# Patient Record
Sex: Male | Born: 1960 | Race: White | Hispanic: No | Marital: Married | Smoking: Never smoker
Health system: Southern US, Community
[De-identification: ages and names within clinical notes are randomized; demographics above are authoritative.]

## PROBLEM LIST (undated history)

## (undated) DIAGNOSIS — E7800 Pure hypercholesterolemia, unspecified: Secondary | ICD-10-CM

## (undated) DIAGNOSIS — J302 Other seasonal allergic rhinitis: Secondary | ICD-10-CM

## (undated) DIAGNOSIS — R7989 Other specified abnormal findings of blood chemistry: Secondary | ICD-10-CM

---

## 2011-10-05 ENCOUNTER — Encounter (HOSPITAL_BASED_OUTPATIENT_CLINIC_OR_DEPARTMENT_OTHER): Payer: Self-pay | Admitting: *Deleted

## 2011-10-05 ENCOUNTER — Emergency Department (HOSPITAL_BASED_OUTPATIENT_CLINIC_OR_DEPARTMENT_OTHER)
Admission: EM | Admit: 2011-10-05 | Discharge: 2011-10-06 | Disposition: A | Discharge status: Home / Self Care | Payer: 59 | Attending: Emergency Medicine | Admitting: Emergency Medicine

## 2011-10-05 DIAGNOSIS — E7800 Pure hypercholesterolemia, unspecified: Secondary | ICD-10-CM | POA: Insufficient documentation

## 2011-10-05 DIAGNOSIS — K3189 Other diseases of stomach and duodenum: Secondary | ICD-10-CM | POA: Insufficient documentation

## 2011-10-05 DIAGNOSIS — R002 Palpitations: Secondary | ICD-10-CM | POA: Insufficient documentation

## 2011-10-05 DIAGNOSIS — R0789 Other chest pain: Secondary | ICD-10-CM | POA: Insufficient documentation

## 2011-10-05 HISTORY — DX: Other seasonal allergic rhinitis: J30.2

## 2011-10-05 HISTORY — DX: Pure hypercholesterolemia, unspecified: E78.00

## 2011-10-05 HISTORY — DX: Other specified abnormal findings of blood chemistry: R79.89

## 2011-10-05 NOTE — ED Notes (Signed)
Pt states he was awakened this p.m. with chest tightness and heart racing. "Indigestion" Felt "gassy". Taken to ED 8. EKG obtained. Placed on monitor showing NSR. Dr. Anitra Lauth in to evaluate.

## 2011-10-05 NOTE — ED Provider Notes (Addendum)
History  This chart was scribed for Glenn Sprout, MD by Shari Heritage. The patient was seen in room MH08/MH08. Patient's care was started at 2350.     CSN: [id number]  Arrival date & time 10/05/11  2345   First MD Initiated Contact with Patient 10/05/11 2350      Chief Complaint  Patient presents with  . Chest Pain    The history is provided by the patient. No language interpreter was used.    Glenn Wallace is a 51 y.o. male who presents to the Emergency Department complaining of an episode of pressure like, moderate, non-radiating chest pain with associated heart palpitations and indigestion-like discomfort onset 1 hour ago. Patient denies SOB, nausea or vomiting. Patient says that palpitations and chest pain woke him up at about 11:00pm. Patient states that chest pressure is resolved, but he still has some indigestion. He states that he experiences some indigestion every few months, but it is not recurrent or chronic. Patient states that he had a brownie and a glass of milk before bed and attended a cook out earlier in the day where he had 1 beer. Patient has a history of hypercholesteremia and is taking Lipitor daily. He also has a history of seasonal allergies and low testosterone. Patient has a family history of HTN (mother) and heart attack (maternal grandfather at 53). Patient had an EKG done several years ago. Patient has never smoked.  Past Medical History  Diagnosis Date  . Hypercholesteremia   . Seasonal allergies   . Low testosterone     History reviewed. No pertinent past surgical history.  History reviewed. No pertinent family history.  History  Substance Use Topics  . Smoking status: Not on file  . Smokeless tobacco: Not on file  . Alcohol Use: Not on file      Review of Systems A complete 10 system review of systems was obtained and all systems are negative except as noted in the HPI and PMH.   Allergies  Review of patient's allergies indicates not on  file.  Home Medications  No current outpatient prescriptions on file.  BP 137/98  Pulse 101  Temp 97.9 F (36.6 C) (Oral)  Resp 20  Ht 5\' 9"  (1.753 m)  Wt 235 lb (106.595 kg)  BMI 34.70 kg/m2  SpO2 98%  Physical Exam  Nursing note and vitals reviewed. Constitutional: He is oriented to person, place, and time. He appears well-developed and well-nourished.  HENT:  Head: Normocephalic and atraumatic.  Eyes: Conjunctivae normal and EOM are normal. Pupils are equal, round, and reactive to light.  Neck: Normal range of motion. Neck supple.  Cardiovascular: Normal rate, regular rhythm and normal heart sounds.   Pulmonary/Chest: Effort normal and breath sounds normal.  Abdominal: Soft. Bowel sounds are normal.  Musculoskeletal: Normal range of motion.  Neurological: He is alert and oriented to person, place, and time.  Skin: Skin is warm and dry.  Psychiatric: He has a normal mood and affect.    ED Course  Procedures (including critical care time) DIAGNOSTIC STUDIES: Oxygen Saturation is 98% on room air, normal by my interpretation.    COORDINATION OF CARE: 11:57pm- Patient informed of current plan for treatment and evaluation and agrees with plan at this time.    Labs Reviewed  BASIC METABOLIC PANEL - Abnormal; Notable for the following:    Glucose, Bld 128 (*)     BUN 24 (*)     GFR calc non Af Amer 86 (*)  All other components within normal limits  CBC WITH DIFFERENTIAL  TROPONIN I  TROPONIN I  TROPONIN I    Dg Chest 2 View  10/06/2011  *RADIOLOGY REPORT*  Clinical Data: Chest pain, palpitations.  CHEST - 2 VIEW  Comparison: None.  Findings: Heart size within normal limits.  Mild left lung base opacity.  Right hemidiaphragm eventration.  Hypoaeration with interstitial and vascular crowding.  No focal consolidation otherwise.  No pleural effusion or pneumothorax.  No acute osseous finding.  IMPRESSION: Mild left lung base atelectasis or scarring.  Interstitial and  vascular crowding without focal consolidation.   Original Report Authenticated By: Waneta Martins, M.D.      Date: 10/06/2011  Rate: 92  Rhythm: normal sinus rhythm  QRS Axis: normal  Intervals: normal  ST/T Wave abnormalities: normal  Conduction Disutrbances: none  Narrative Interpretation: unremarkable     1. Atypical chest pain   2. Palpitations       MDM   Patient was awoke tonight after falling asleep with chest tightness and heart racing. He states this has been ongoing for the last one hour. At home he was checking his pulse and it was between 120 and 150. He was unclear if it was irregular. Since he's been here his pulse is been less than 100 and regular. EKG is within normal limits. Patient's only risk factor for cardiac disease is hypercholesterolemia. He's never had chest pain before. He states in the past he had an EKG in his PCP office which was normal but he has had no other cardiac workup. Patient does have a concerning story today it is unclear whether the rapid heart rate was causing his chest tightness versus chest tightness causing a rapid heart rate. Currently he is just complaining of some mild feelings of indigestion but no further chest tightness which was on both sides wrapping around his chest. Feel he would be a good candidate for the low risk chest pain protocol and CT. CBC, BMP, cardiac enzymes, chest x-ray pending. Patient given aspirin and nitroglycerin.  1:51 AM Labs and plain film wnl.  Pt's pain had resolved and did not require NTG.  He took 325 of aspirin just PTA.  Discussed with pt low risk chest pain protocol and he did not want to stay due to children at home and his wife out of town.  He did agree to stay for 3 hour markers which would place him 4 hours out from when pain started.  Pt understands risk and wants to f/u with PCP on Monday.  Feel this is reasonable as pt is low risk and TIMI 0.  3:46 AM Second troponin is neg.  Pt continues to be  pain and palpitation free.  D/ced home.   I personally performed the services described in this documentation, which was scribed in my presence.  The recorded information has been reviewed and considered.    Glenn Sprout, MD 10/06/11 1610  Glenn Sprout, MD 10/06/11 9604  Glenn Sprout, MD 10/06/11 [id number]

## 2011-10-06 ENCOUNTER — Encounter (HOSPITAL_BASED_OUTPATIENT_CLINIC_OR_DEPARTMENT_OTHER): Payer: Self-pay

## 2011-10-06 ENCOUNTER — Emergency Department (HOSPITAL_BASED_OUTPATIENT_CLINIC_OR_DEPARTMENT_OTHER): Payer: 59

## 2011-10-06 LAB — CBC WITH DIFFERENTIAL/PLATELET
Basophils Absolute: 0.1 10*3/uL (ref 0.0–0.1)
Basophils Relative: 1 % (ref 0–1)
MCHC: 35.4 g/dL (ref 30.0–36.0)
Neutro Abs: 4 10*3/uL (ref 1.7–7.7)
Neutrophils Relative %: 49 % (ref 43–77)
Platelets: 280 10*3/uL (ref 150–400)
RDW: 13.2 % (ref 11.5–15.5)

## 2011-10-06 LAB — TROPONIN I: Troponin I: <0.3 ng/mL (ref <0.30)

## 2011-10-06 LAB — BASIC METABOLIC PANEL
Chloride: 105 mEq/L (ref 96–112)
GFR calc Af Amer: >90 mL/min (ref >90)
GFR calc non Af Amer: 86 mL/min — ABNORMAL LOW (ref >90)
Potassium: 3.9 mEq/L (ref 3.5–5.1)

## 2011-10-06 MED ORDER — ASPIRIN 81 MG PO CHEW
324.0000 mg | CHEWABLE_TABLET | Freq: Once | ORAL | Status: DC
  Filled 2011-10-06: qty 4

## 2011-10-06 MED ORDER — NITROGLYCERIN 2 % TD OINT
TOPICAL_OINTMENT | TRANSDERMAL | Status: AC
  Filled 2011-10-06: qty 1

## 2011-10-06 MED ORDER — NITROGLYCERIN 0.4 MG SL SUBL
0.4000 mg | SUBLINGUAL_TABLET | SUBLINGUAL | Status: DC | PRN
  Administered 2011-10-06: 0.4 mg via SUBLINGUAL
  Filled 2011-10-06: qty 25

## 2011-10-06 NOTE — ED Notes (Signed)
Pt presents to ED today with centralized chest pain that started while pt was sleeping.  Pt rates 5/10 on scale now but states was 10/10 at onset.  Pt reports non-radiating.

## 2011-10-06 NOTE — ED Notes (Signed)
Pt. Denies any c/p sob. C/o Burning to upper abd area. Nitro given SL per order will monitor.

## 2011-10-06 NOTE — ED Notes (Signed)
Denies any change in "burning" to abdomen area after one nitro.

## 2011-10-06 NOTE — ED Notes (Signed)
Returned from  X-ray

## 2011-10-06 NOTE — ED Notes (Signed)
Pt. States he took 2 adult ASA at home around 2330. MD aware and will hold ASA order at this time.

## 2015-05-28 ENCOUNTER — Emergency Department (HOSPITAL_BASED_OUTPATIENT_CLINIC_OR_DEPARTMENT_OTHER): Payer: BLUE CROSS/BLUE SHIELD

## 2015-05-28 ENCOUNTER — Encounter (HOSPITAL_BASED_OUTPATIENT_CLINIC_OR_DEPARTMENT_OTHER): Payer: Self-pay

## 2015-05-28 ENCOUNTER — Observation Stay (HOSPITAL_BASED_OUTPATIENT_CLINIC_OR_DEPARTMENT_OTHER)
Admission: EM | Admit: 2015-05-28 | Discharge: 2015-05-29 | Disposition: A | Discharge status: Home / Self Care | Payer: BLUE CROSS/BLUE SHIELD | Attending: General Surgery | Admitting: General Surgery

## 2015-05-28 DIAGNOSIS — S2232XA Fracture of one rib, left side, initial encounter for closed fracture: Secondary | ICD-10-CM

## 2015-05-28 DIAGNOSIS — E7800 Pure hypercholesterolemia, unspecified: Secondary | ICD-10-CM | POA: Diagnosis not present

## 2015-05-28 DIAGNOSIS — Y93E9 Activity, other interior property and clothing maintenance: Secondary | ICD-10-CM | POA: Diagnosis not present

## 2015-05-28 DIAGNOSIS — S2242XA Multiple fractures of ribs, left side, initial encounter for closed fracture: Secondary | ICD-10-CM | POA: Diagnosis not present

## 2015-05-28 DIAGNOSIS — Y998 Other external cause status: Secondary | ICD-10-CM | POA: Diagnosis not present

## 2015-05-28 DIAGNOSIS — W133XXA Fall through floor, initial encounter: Secondary | ICD-10-CM | POA: Diagnosis not present

## 2015-05-28 DIAGNOSIS — Z7982 Long term (current) use of aspirin: Secondary | ICD-10-CM | POA: Diagnosis not present

## 2015-05-28 DIAGNOSIS — I471 Supraventricular tachycardia: Secondary | ICD-10-CM | POA: Diagnosis not present

## 2015-05-28 DIAGNOSIS — Y92015 Private garage of single-family (private) house as the place of occurrence of the external cause: Secondary | ICD-10-CM | POA: Insufficient documentation

## 2015-05-28 DIAGNOSIS — S270XXA Traumatic pneumothorax, initial encounter: Secondary | ICD-10-CM | POA: Diagnosis present

## 2015-05-28 DIAGNOSIS — J939 Pneumothorax, unspecified: Secondary | ICD-10-CM

## 2015-05-28 DIAGNOSIS — Z09 Encounter for follow-up examination after completed treatment for conditions other than malignant neoplasm: Secondary | ICD-10-CM

## 2015-05-28 DIAGNOSIS — W19XXXA Unspecified fall, initial encounter: Secondary | ICD-10-CM

## 2015-05-28 LAB — BASIC METABOLIC PANEL
Anion gap: 5 (ref 5–15)
BUN: 23 mg/dL — ABNORMAL HIGH (ref 6–20)
CHLORIDE: 107 mmol/L (ref 101–111)
CO2: 25 mmol/L (ref 22–32)
CREATININE: 1.5 mg/dL — AB (ref 0.61–1.24)
Calcium: 8.7 mg/dL — ABNORMAL LOW (ref 8.9–10.3)
GFR calc non Af Amer: 51 mL/min — ABNORMAL LOW (ref >60)
GFR, EST AFRICAN AMERICAN: 59 mL/min — AB (ref >60)
Glucose, Bld: 138 mg/dL — ABNORMAL HIGH (ref 65–99)
POTASSIUM: 3.8 mmol/L (ref 3.5–5.1)
SODIUM: 137 mmol/L (ref 135–145)

## 2015-05-28 LAB — CBC WITH DIFFERENTIAL/PLATELET
BASOS PCT: 0 %
Basophils Absolute: 0 10*3/uL (ref 0.0–0.1)
EOS ABS: 0.2 10*3/uL (ref 0.0–0.7)
Eosinophils Relative: 2 %
HEMATOCRIT: 41.1 % (ref 39.0–52.0)
HEMOGLOBIN: 14.3 g/dL (ref 13.0–17.0)
LYMPHS ABS: 4.8 10*3/uL — AB (ref 0.7–4.0)
Lymphocytes Relative: 49 %
MCH: 30.6 pg (ref 26.0–34.0)
MCHC: 34.8 g/dL (ref 30.0–36.0)
MCV: 88 fL (ref 78.0–100.0)
MONOS PCT: 10 %
Monocytes Absolute: 1 10*3/uL (ref 0.1–1.0)
NEUTROS ABS: 3.9 10*3/uL (ref 1.7–7.7)
NEUTROS PCT: 39 %
Platelets: 320 10*3/uL (ref 150–400)
RBC: 4.67 MIL/uL (ref 4.22–5.81)
RDW: 13.2 % (ref 11.5–15.5)
WBC: 9.9 10*3/uL (ref 4.0–10.5)

## 2015-05-28 LAB — TROPONIN I

## 2015-05-28 MED ORDER — IOPAMIDOL (ISOVUE-300) INJECTION 61%
100.0000 mL | Freq: Once | INTRAVENOUS | Status: AC | PRN
Start: 2015-05-28 — End: 2015-05-28
  Administered 2015-05-28: 100 mL via INTRAVENOUS

## 2015-05-28 MED ORDER — SODIUM CHLORIDE 0.9 % IV BOLUS (SEPSIS)
500.0000 mL | Freq: Once | INTRAVENOUS | Status: AC
  Administered 2015-05-28: 500 mL via INTRAVENOUS

## 2015-05-28 MED ORDER — HYDROMORPHONE HCL 1 MG/ML IJ SOLN
0.5000 mg | Freq: Once | INTRAMUSCULAR | Status: AC
  Administered 2015-05-28: 0.5 mg via INTRAVENOUS
  Filled 2015-05-28: qty 1

## 2015-05-28 MED ORDER — FENTANYL CITRATE (PF) 100 MCG/2ML IJ SOLN
100.0000 ug | Freq: Once | INTRAMUSCULAR | Status: AC
  Administered 2015-05-28: 100 ug via INTRAVENOUS
  Filled 2015-05-28: qty 2

## 2015-05-28 NOTE — ED Provider Notes (Signed)
CSN: [id number]     Arrival date & time 05/28/15  2036 History  By signing my name below, I, Phillis Haggis, attest that this documentation has been prepared under the direction and in the presence of Benjiman Core, MD. Electronically Signed: Phillis Haggis, ED Scribe. 05/28/2015. 8:57 PM.  Chief Complaint  Patient presents with  . Fall   The history is provided by the patient. No language interpreter was used.  HPI Comments: Glenn Wallace is a 55 y.o. male who presents to the Emergency Department complaining of a fall onset PTA. Pt fell through the drywall of his attic, about 12 feet above ground, and landed on cement stairs. He reports pain to his entire left side including the ribs, abdomen, upper back, leg and arm. Pt has associated abrasions to the right leg, upper back, right arm and left arm. He denies hitting head, LOC, neck pain, or headache. States he has pain with breathing now. Difficult finding a comfortable position. He is not on anticoagulation.  Past Medical History  Diagnosis Date  . Hypercholesteremia   . Seasonal allergies   . Low testosterone    History reviewed. No pertinent past surgical history. Family History  Problem Relation Age of Onset  . Hypertension Mother   . Heart attack Other    Social History  Substance Use Topics  . Smoking status: Never Smoker   . Smokeless tobacco: None  . Alcohol Use: Yes    Review of Systems  Constitutional: Negative for fever.  Eyes: Negative for pain.  Respiratory: Positive for shortness of breath.   Cardiovascular: Positive for chest pain (left ribs).  Gastrointestinal: Positive for abdominal pain.  Genitourinary: Positive for flank pain.  Musculoskeletal: Positive for back pain and arthralgias. Negative for neck pain.  Skin: Positive for wound.  Neurological: Negative for syncope and headaches.  Hematological: Does not bruise/bleed easily.  All other systems reviewed and are negative.   Allergies  Review of  patient's allergies indicates no known allergies.  Home Medications   Prior to Admission medications   Medication Sig Start Date End Date Taking? Authorizing Provider  atorvastatin (LIPITOR) 20 MG tablet Take 20 mg by mouth daily.    Historical Provider, MD  cetirizine (ZYRTEC) 10 MG tablet Take 10 mg by mouth daily.    Historical Provider, MD  Testosterone (TESTIM TD) Place 0.1 mg onto the skin daily.    Historical Provider, MD   BP 130/74 mmHg  Pulse 93  Temp(Src) 97.5 F (36.4 C) (Oral)  Resp 18  SpO2 95% Physical Exam  Constitutional: He is oriented to person, place, and time. He appears well-developed and well-nourished.  uncomfortable  HENT:  Head: Normocephalic and atraumatic.  Eyes: EOM are normal. Pupils are equal, round, and reactive to light.  Neck: Normal range of motion. Neck supple.  Cardiovascular: Regular rhythm and normal heart sounds.  Tachycardia present.  Exam reveals no gallop and no friction rub.   No murmur heard. Tachycardia  Pulmonary/Chest: Effort normal and breath sounds normal. Tachypnea noted. He has no wheezes.  Equal breath sounds bilaterally. Moderate tenderness to left lateral chest wall. No initial subcutaneous emphysema.  Abdominal: Soft. There is tenderness in the left upper quadrant.  LUQ and left subcostal tenderness  Musculoskeletal: Normal range of motion.  Neurological: He is alert and oriented to person, place, and time.  Skin: Skin is warm. He is diaphoretic.  Abrasions to left forearm and elbow with no underlying bony tenderness echymotic area to left lateral lower mid  ribs posteriorly.  2 smaller linear abrasions to left leg with no underlying bony tenderness 1 small abrasion to right arm proximal to the elbow with no underlying bony tenderness Superficial abrasion/laceration to right anterior lower leg  Psychiatric: He has a normal mood and affect. His behavior is normal.  Nursing note and vitals reviewed.   ED Course   Procedures (including critical care time) DIAGNOSTIC STUDIES: Oxygen Saturation is 94% on RA, normal by my interpretation.    COORDINATION OF CARE: 8:55 PM-Discussed treatment plan which includes IV and x-ray with pt at bedside and pt agreed to plan.    Labs Review Labs Reviewed  CBC WITH DIFFERENTIAL/PLATELET - Abnormal; Notable for the following:    Lymphs Abs 4.8 (*)    All other components within normal limits  BASIC METABOLIC PANEL - Abnormal; Notable for the following:    Glucose, Bld 138 (*)    BUN 23 (*)    Creatinine, Ser 1.50 (*)    Calcium 8.7 (*)    GFR calc non Af Amer 51 (*)    GFR calc Af Amer 59 (*)    All other components within normal limits  TROPONIN I    Imaging Review Ct Chest W Contrast  05/28/2015  CLINICAL DATA:  Larey SeatFell through a ceiling.  Landed on cement stairs. EXAM: CT CHEST, ABDOMEN, AND PELVIS WITH CONTRAST TECHNIQUE: Multidetector CT imaging of the chest, abdomen and pelvis was performed following the standard protocol during bolus administration of intravenous contrast. CONTRAST:  100mL ISOVUE-300 IOPAMIDOL (ISOVUE-300) INJECTION 61% COMPARISON:  None. FINDINGS: CT CHEST There is a small left pneumothorax, collected in the anterior base, around 5% by volume. Mild ground-glass opacity in the lingula may be due to contusion or hemorrhage. The right lung is clear. Airways are patent and intact. No effusion. No intrathoracic vascular injury. Mediastinum is intact. There are fractures of the left fifth sixth and seventh ribs laterally. Sternum and vertebral column are intact. CT ABDOMEN AND PELVIS There are unremarkable intact appearances of the liver, spleen, pancreas, adrenals and kidneys. The abdominal aorta is normal in caliber with mild atherosclerotic calcification. Bowel is unremarkable. There is no peritoneal blood or free air. No fractures are evident in the abdomen or pelvis. IMPRESSION: 1. Very small left pneumothorax collected anteriorly. Estimate 5% by  volume. 2. Acute fractures of the left fifth through seventh ribs laterally 3. No acute traumatic injury in the abdomen or pelvis 4. These results will be called to the ordering clinician or representative by the Radiologist Assistant, and communication documented in the PACS or zVision Dashboard. Electronically Signed   By: Ellery Plunkaniel R Mitchell M.D.   On: 05/28/2015 21:48   Ct Abdomen Pelvis W Contrast  05/28/2015  CLINICAL DATA:  Larey SeatFell through a ceiling.  Landed on cement stairs. EXAM: CT CHEST, ABDOMEN, AND PELVIS WITH CONTRAST TECHNIQUE: Multidetector CT imaging of the chest, abdomen and pelvis was performed following the standard protocol during bolus administration of intravenous contrast. CONTRAST:  100mL ISOVUE-300 IOPAMIDOL (ISOVUE-300) INJECTION 61% COMPARISON:  None. FINDINGS: CT CHEST There is a small left pneumothorax, collected in the anterior base, around 5% by volume. Mild ground-glass opacity in the lingula may be due to contusion or hemorrhage. The right lung is clear. Airways are patent and intact. No effusion. No intrathoracic vascular injury. Mediastinum is intact. There are fractures of the left fifth sixth and seventh ribs laterally. Sternum and vertebral column are intact. CT ABDOMEN AND PELVIS There are unremarkable intact appearances of the liver,  spleen, pancreas, adrenals and kidneys. The abdominal aorta is normal in caliber with mild atherosclerotic calcification. Bowel is unremarkable. There is no peritoneal blood or free air. No fractures are evident in the abdomen or pelvis. IMPRESSION: 1. Very small left pneumothorax collected anteriorly. Estimate 5% by volume. 2. Acute fractures of the left fifth through seventh ribs laterally 3. No acute traumatic injury in the abdomen or pelvis 4. These results will be called to the ordering clinician or representative by the Radiologist Assistant, and communication documented in the PACS or zVision Dashboard. Electronically Signed   By: Ellery Plunk M.D.   On: 05/28/2015 21:48   Dg Chest Portable 1 View  05/28/2015  CLINICAL DATA:  Trauma -fall 12 feet onto cement. Initial encounter. EXAM: PORTABLE CHEST 1 VIEW COMPARISON:  10/06/2011 FINDINGS: The cardiomediastinal silhouette is unremarkable. This is a mildly low volume film. There is no evidence of focal airspace disease, pulmonary edema, suspicious pulmonary nodule/mass, pleural effusion, or pneumothorax. Fractures of the lateral left fifth, sixth and seventh ribs noted. IMPRESSION: Fractures of the lateral left fifth sixth and seventh ribs. No evidence of hemothorax or pneumothorax. Electronically Signed   By: Harmon Pier M.D.   On: 05/28/2015 21:16   I have personally reviewed and evaluated these images and lab results as part of my medical decision-making.   EKG Interpretation   Date/Time:  Sunday May 28 2015 21:08:05 EDT Ventricular Rate:  187 PR Interval:  101 QRS Duration: 91 QT Interval:  264 QTC Calculation: 466 R Axis:   0 Text Interpretation:  Supraventricular tachycardia Abnormal R-wave  progression, late transition Repolarization abnormality, prob rate related  Baseline wander in lead(s) V1 Confirmed by Rubin Payor  MD, Deby Adger [phone])  on 05/28/2015 9:39:27 PM    EKG Interpretation  Date/Time:  Sunday May 28 2015 21:38:50 EDT Ventricular Rate:  93 PR Interval:  161 QRS Duration: 95 QT Interval:  352 QTC Calculation: 438 R Axis:   6 Text Interpretation:  Sinus rhythm Probable left atrial enlargement Abnormal R-wave progression, late transition Confirmed by Rubin Payor  MD, Harrold Donath [phone]) on 05/28/2015 10:22:16 PM          MDM   Final diagnoses:  Fall, initial encounter  Rib fractures, left, closed, initial encounter  Pneumothorax on left  SVT (supraventricular tachycardia) (HCC)  Patient presented after a 12 foot fall on the stairs. Some various extremity wounds but does not appear to need imaging of them at this time. Does however have severe chest  pain and severe tachycardia. Found to be in initial SVT, which was new to patient. Was diaphoretic. Initial chest x-ray did not show pneumothorax did show some rib fractures. Felt better after converted to sinus rhythm and CT. Found to have rib fractures and around a 5% pneumothorax. Did have abdominal tenderness but CT negative for intra-abdominal injury. Discussed with Dr. Dwain Sarna at [facility] who accepted the patient in transfer. Also discussed with Dr.Steinl. Will transfer.   I personally performed the services described in this documentation, which was scribed in my presence. The recorded information has been reviewed and is accurate.   CRITICAL CARE Performed by: Billee Cashing Total critical care time: 35 minutes Critical care time was exclusive of separately billable procedures and treating other patients. Critical care was necessary to treat or prevent imminent or life-threatening deterioration. Critical care was time spent personally by me on the following activities: development of treatment plan with patient and/or surrogate as well as nursing, discussions with consultants, evaluation  of patient's response to treatment, examination of patient, obtaining history from patient or surrogate, ordering and performing treatments and interventions, ordering and review of laboratory studies, ordering and review of radiographic studies, pulse oximetry and re-evaluation of patient's condition.    Benjiman Core, MD 05/28/15 2223

## 2015-05-28 NOTE — ED Notes (Signed)
[facility]  [facility] here to pick pt up.  Pt is alert and oriented.

## 2015-05-28 NOTE — ED Notes (Signed)
Pt reports fall 12 feet through attic, landing on cement stairs. Denies neck pain and head pain. Abrasions to left upper back, BUE, and right lower leg.

## 2015-05-28 NOTE — ED Notes (Signed)
Pt to CT with 2RN and EDP.  Pt was ble to get up and pivot to get on CT table.  Pt SVT with rate up to 191 and once he laid down on CT table rate change to 90, convert to NSR, EDP aware.  Pt continues to have pain with respiration but is calmer, less diaphoretic and states that he feels better.

## 2015-05-29 ENCOUNTER — Observation Stay (HOSPITAL_COMMUNITY): Payer: BLUE CROSS/BLUE SHIELD

## 2015-05-29 DIAGNOSIS — S270XXA Traumatic pneumothorax, initial encounter: Secondary | ICD-10-CM | POA: Diagnosis present

## 2015-05-29 DIAGNOSIS — S2242XA Multiple fractures of ribs, left side, initial encounter for closed fracture: Secondary | ICD-10-CM | POA: Diagnosis present

## 2015-05-29 DIAGNOSIS — W133XXA Fall through floor, initial encounter: Secondary | ICD-10-CM | POA: Diagnosis present

## 2015-05-29 MED ORDER — OXYCODONE HCL 5 MG PO TABS
5.0000 mg | ORAL_TABLET | ORAL | Status: DC | PRN
  Administered 2015-05-29: 5 mg via ORAL
  Filled 2015-05-29: qty 1

## 2015-05-29 MED ORDER — OXYCODONE-ACETAMINOPHEN 5-325 MG PO TABS: 1.0000 | ORAL_TABLET | ORAL | Status: AC | PRN

## 2015-05-29 MED ORDER — ONDANSETRON HCL 4 MG/2ML IJ SOLN: 4.0000 mg | Freq: Four times a day (QID) | INTRAMUSCULAR | Status: DC | PRN

## 2015-05-29 MED ORDER — ONDANSETRON HCL 4 MG PO TABS: 4.0000 mg | ORAL_TABLET | Freq: Four times a day (QID) | ORAL | Status: DC | PRN

## 2015-05-29 MED ORDER — ENOXAPARIN SODIUM 40 MG/0.4ML ~~LOC~~ SOLN
40.0000 mg | SUBCUTANEOUS | Status: DC
  Administered 2015-05-29: 40 mg via SUBCUTANEOUS
  Filled 2015-05-29: qty 0.4

## 2015-05-29 MED ORDER — OXYCODONE HCL 5 MG PO TABS
10.0000 mg | ORAL_TABLET | ORAL | Status: DC | PRN
  Administered 2015-05-29 (×3): 10 mg via ORAL
  Filled 2015-05-29 (×3): qty 2

## 2015-05-29 MED ORDER — SODIUM CHLORIDE 0.9 % IV SOLN
INTRAVENOUS | Status: DC
  Administered 2015-05-29: 75 mL/h via INTRAVENOUS

## 2015-05-29 MED ORDER — HYDROMORPHONE HCL 1 MG/ML IJ SOLN: 1.0000 mg | INTRAMUSCULAR | Status: DC | PRN

## 2015-05-29 MED ORDER — ACETAMINOPHEN 500 MG PO TABS
1000.0000 mg | ORAL_TABLET | Freq: Four times a day (QID) | ORAL | Status: DC
  Administered 2015-05-29 (×2): 1000 mg via ORAL
  Filled 2015-05-29 (×2): qty 2

## 2015-05-29 NOTE — Progress Notes (Signed)
Trauma Service Note  Subjective: Patient doing well this AM.  Pain is mostly on the left side.  Able to get IS up to 1500  Objective: Vital signs in last 24 hours: Temp:  [97.5 F (36.4 C)-98.6 F (37 C)] 98 F (36.7 C) (05/08 0536) Pulse Rate:  [64-99] 81 (05/08 0536) Resp:  [18-25] 18 (05/08 0536) BP: (112-136)/(66-99) 112/66 mmHg (05/08 0536) SpO2:  [94 %-100 %] 94 % (05/08 0536) Weight:  [113.944 kg (251 lb 3.2 oz)] 113.944 kg (251 lb 3.2 oz) (05/08 0152) Last BM Date: 05/28/15  Intake/Output from previous day: 05/07 0701 - 05/08 0700 In: -  Out: 325 [Urine:325] Intake/Output this shift:    General: No acute distress at rest  Lungs: Clear.  No decrease on the left.  IS up to 1500 without pre-medication.  Abd: Benign  Extremities: No clinical signs or symptoms of DVT  Neuro: Intact  Lab Results: CBC   Recent Labs  05/28/15 2056  WBC 9.9  HGB 14.3  HCT 41.1  PLT 320   BMET  Recent Labs  05/28/15 2056  NA 137  K 3.8  CL 107  CO2 25  GLUCOSE 138*  BUN 23*  CREATININE 1.50*  CALCIUM 8.7*   PT/INR No results for input(s): LABPROT, INR in the last 72 hours. ABG No results for input(s): PHART, HCO3 in the last 72 hours.  Invalid input(s): PCO2, PO2  Studies/Results: Ct Chest W Contrast  05/28/2015  CLINICAL DATA:  Larey SeatFell through a ceiling.  Landed on cement stairs. EXAM: CT CHEST, ABDOMEN, AND PELVIS WITH CONTRAST TECHNIQUE: Multidetector CT imaging of the chest, abdomen and pelvis was performed following the standard protocol during bolus administration of intravenous contrast. CONTRAST:  100mL ISOVUE-300 IOPAMIDOL (ISOVUE-300) INJECTION 61% COMPARISON:  None. FINDINGS: CT CHEST There is a small left pneumothorax, collected in the anterior base, around 5% by volume. Mild ground-glass opacity in the lingula may be due to contusion or hemorrhage. The right lung is clear. Airways are patent and intact. No effusion. No intrathoracic vascular injury.  Mediastinum is intact. There are fractures of the left fifth sixth and seventh ribs laterally. Sternum and vertebral column are intact. CT ABDOMEN AND PELVIS There are unremarkable intact appearances of the liver, spleen, pancreas, adrenals and kidneys. The abdominal aorta is normal in caliber with mild atherosclerotic calcification. Bowel is unremarkable. There is no peritoneal blood or free air. No fractures are evident in the abdomen or pelvis. IMPRESSION: 1. Very small left pneumothorax collected anteriorly. Estimate 5% by volume. 2. Acute fractures of the left fifth through seventh ribs laterally 3. No acute traumatic injury in the abdomen or pelvis 4. These results will be called to the ordering clinician or representative by the Radiologist Assistant, and communication documented in the PACS or zVision Dashboard. Electronically Signed   By: Ellery Plunkaniel R Mitchell M.D.   On: 05/28/2015 21:48   Ct Abdomen Pelvis W Contrast  05/28/2015  CLINICAL DATA:  Larey SeatFell through a ceiling.  Landed on cement stairs. EXAM: CT CHEST, ABDOMEN, AND PELVIS WITH CONTRAST TECHNIQUE: Multidetector CT imaging of the chest, abdomen and pelvis was performed following the standard protocol during bolus administration of intravenous contrast. CONTRAST:  100mL ISOVUE-300 IOPAMIDOL (ISOVUE-300) INJECTION 61% COMPARISON:  None. FINDINGS: CT CHEST There is a small left pneumothorax, collected in the anterior base, around 5% by volume. Mild ground-glass opacity in the lingula may be due to contusion or hemorrhage. The right lung is clear. Airways are patent and intact. No effusion.  No intrathoracic vascular injury. Mediastinum is intact. There are fractures of the left fifth sixth and seventh ribs laterally. Sternum and vertebral column are intact. CT ABDOMEN AND PELVIS There are unremarkable intact appearances of the liver, spleen, pancreas, adrenals and kidneys. The abdominal aorta is normal in caliber with mild atherosclerotic calcification.  Bowel is unremarkable. There is no peritoneal blood or free air. No fractures are evident in the abdomen or pelvis. IMPRESSION: 1. Very small left pneumothorax collected anteriorly. Estimate 5% by volume. 2. Acute fractures of the left fifth through seventh ribs laterally 3. No acute traumatic injury in the abdomen or pelvis 4. These results will be called to the ordering clinician or representative by the Radiologist Assistant, and communication documented in the PACS or zVision Dashboard. Electronically Signed   By: Ellery Plunk M.D.   On: 05/28/2015 21:48   Dg Chest Portable 1 View  05/28/2015  CLINICAL DATA:  Trauma -fall 12 feet onto cement. Initial encounter. EXAM: PORTABLE CHEST 1 VIEW COMPARISON:  10/06/2011 FINDINGS: The cardiomediastinal silhouette is unremarkable. This is a mildly low volume film. There is no evidence of focal airspace disease, pulmonary edema, suspicious pulmonary nodule/mass, pleural effusion, or pneumothorax. Fractures of the lateral left fifth, sixth and seventh ribs noted. IMPRESSION: Fractures of the lateral left fifth sixth and seventh ribs. No evidence of hemothorax or pneumothorax. Electronically Signed   By: Harmon Pier M.D.   On: 05/28/2015 21:16    Anti-infectives: Anti-infectives    None      Assessment/Plan: s/p  Discharge Will check CXR today, if okay patient can go home.    Marta Lamas. Gae Bon, MD, FACS [phone] Trauma Surgeon 05/29/2015

## 2015-05-29 NOTE — Discharge Summary (Signed)
Physician Discharge Summary  Patient ID: Glenn RosierDavid Dematteo MRN: [id number] DOB/AGE: [birth year] 55 y.o.  Admit date: 05/28/2015 Discharge date: 05/29/2015  Discharge Diagnoses Patient Active Problem List   Diagnosis Date Noted  . Traumatic pneumothorax 05/29/2015  . Fall through floor 05/29/2015  . Fracture of multiple ribs of left side 05/29/2015    Consultants None   Procedures None   HPI: [name] was cleaning bat guano from his attic over a garage when he fell through the ceiling onto his left side on a brick staircase. He was alert and remembered the whole event. His workup included CT scans of the chest, abdomen, and pelvis which showed the above-mentioned injuries. He was admitted to the trauma service.   Hospital Course: The patient did well overnight. He was able to mobilize independently and his pain was controlled on oral medications. A follow-up chest x-ray the following day did not show any extension of his pneumothorax. He was discharged home in good condition.     Medication List    TAKE these medications        aspirin 81 MG chewable tablet  Chew 81 mg by mouth daily.     cetirizine 10 MG tablet  Commonly known as:  ZYRTEC  Take 10 mg by mouth daily.     oxyCODONE-acetaminophen 5-325 MG tablet  Commonly known as:  ROXICET  Take 1-2 tablets by mouth every 4 (four) hours as needed (Pain).            Follow-up Information    Call [facility].   Why:  As needed   Contact information:   [address] [id number] [place] [id number] [phone]       Signed: Freeman CaldronMichael J. Nisaiah Bechtol, PA-C Pager: [phone] General Trauma PA Pager: [phone] 05/29/2015, 1:44 PM

## 2015-05-29 NOTE — Progress Notes (Signed)
Order received to discharge.  Telemetry removed and CCMD notified.  IV removed with catheter intact.  Discharge education given to Pt with wife at bedside.  All questions answered.  Pt pain controlled at time of discharge.  Pt denies SOB.  Pt stable to discharge.

## 2015-05-29 NOTE — Discharge Instructions (Signed)
No driving while taking oxycodone. ° °Increase activity as pain allows. °

## 2015-05-29 NOTE — ED Notes (Signed)
Trauma at bedside.

## 2015-05-29 NOTE — H&P (Signed)
Glenn Wallace is an 55 y.o. male.   Chief Complaint: left chest pain HPI: 55yom who was cleaning bat guano from his attic over garage when he fell through ceiling onto his left side on a brick staircase.  He has left sided pain.  Alert and remembers whole event.  He underwent outside eval with left rib fx and small ptx.   Past Medical History  Diagnosis Date  . Hypercholesteremia   . Seasonal allergies   . Low testosterone     History reviewed. No pertinent past surgical history.  Family History  Problem Relation Age of Onset  . Hypertension Mother   . Heart attack Other    Social History:  reports that he has never smoked. He does not have any smokeless tobacco history on file. He reports that he drinks alcohol. He reports that he does not use illicit drugs.  Allergies: No Known Allergies  meds none  Results for orders placed or performed during the hospital encounter of 05/28/15 (from the past 48 hour(s))  CBC with Differential     Status: Abnormal   Collection Time: 05/28/15  8:56 PM  Result Value Ref Range   WBC 9.9 4.0 - 10.5 K/uL   RBC 4.67 4.22 - 5.81 MIL/uL   Hemoglobin 14.3 13.0 - 17.0 g/dL   HCT 41.1 39.0 - 52.0 %   MCV 88.0 78.0 - 100.0 fL   MCH 30.6 26.0 - 34.0 pg   MCHC 34.8 30.0 - 36.0 g/dL   RDW 13.2 11.5 - 15.5 %   Platelets 320 150 - 400 K/uL   Neutrophils Relative % 39 %   Neutro Abs 3.9 1.7 - 7.7 K/uL   Lymphocytes Relative 49 %   Lymphs Abs 4.8 (H) 0.7 - 4.0 K/uL   Monocytes Relative 10 %   Monocytes Absolute 1.0 0.1 - 1.0 K/uL   Eosinophils Relative 2 %   Eosinophils Absolute 0.2 0.0 - 0.7 K/uL   Basophils Relative 0 %   Basophils Absolute 0.0 0.0 - 0.1 K/uL  Basic metabolic panel     Status: Abnormal   Collection Time: 05/28/15  8:56 PM  Result Value Ref Range   Sodium 137 135 - 145 mmol/L   Potassium 3.8 3.5 - 5.1 mmol/L   Chloride 107 101 - 111 mmol/L   CO2 25 22 - 32 mmol/L   Glucose, Bld 138 (H) 65 - 99 mg/dL   BUN 23 (H) 6 - 20 mg/dL    Creatinine, Ser 1.50 (H) 0.61 - 1.24 mg/dL   Calcium 8.7 (L) 8.9 - 10.3 mg/dL   GFR calc non Af Amer 51 (L) >60 mL/min   GFR calc Af Amer 59 (L) >60 mL/min    Comment: (NOTE) The eGFR has been calculated using the CKD EPI equation. This calculation has not been validated in all clinical situations. eGFR's persistently <60 mL/min signify possible Chronic Kidney Disease.    Anion gap 5 5 - 15  Troponin I     Status: None   Collection Time: 05/28/15  9:20 PM  Result Value Ref Range   Troponin I <0.03 <0.031 ng/mL    Comment:        NO INDICATION OF MYOCARDIAL INJURY.    Ct Chest W Contrast  05/28/2015  CLINICAL DATA:  [place] through a ceiling.  Landed on cement stairs. EXAM: CT CHEST, ABDOMEN, AND PELVIS WITH CONTRAST TECHNIQUE: Multidetector CT imaging of the chest, abdomen and pelvis was performed following the standard protocol during bolus  administration of intravenous contrast. CONTRAST:  122m ISOVUE-300 IOPAMIDOL (ISOVUE-300) INJECTION 61% COMPARISON:  None. FINDINGS: CT CHEST There is a small left pneumothorax, collected in the anterior base, around 5% by volume. Mild ground-glass opacity in the lingula may be due to contusion or hemorrhage. The right lung is clear. Airways are patent and intact. No effusion. No intrathoracic vascular injury. Mediastinum is intact. There are fractures of the left fifth sixth and seventh ribs laterally. Sternum and vertebral column are intact. CT ABDOMEN AND PELVIS There are unremarkable intact appearances of the liver, spleen, pancreas, adrenals and kidneys. The abdominal aorta is normal in caliber with mild atherosclerotic calcification. Bowel is unremarkable. There is no peritoneal blood or free air. No fractures are evident in the abdomen or pelvis. IMPRESSION: 1. Very small left pneumothorax collected anteriorly. Estimate 5% by volume. 2. Acute fractures of the left fifth through seventh ribs laterally 3. No acute traumatic injury in the abdomen or pelvis  4. These results will be called to the ordering clinician or representative by the Radiologist Assistant, and communication documented in the PACS or zVision Dashboard. Electronically Signed   By: DAndreas NewportM.D.   On: 05/28/2015 21:48   Ct Abdomen Pelvis W Contrast  05/28/2015  CLINICAL DATA:  FGolden Circlethrough a ceiling.  Landed on cement stairs. EXAM: CT CHEST, ABDOMEN, AND PELVIS WITH CONTRAST TECHNIQUE: Multidetector CT imaging of the chest, abdomen and pelvis was performed following the standard protocol during bolus administration of intravenous contrast. CONTRAST:  1074mISOVUE-300 IOPAMIDOL (ISOVUE-300) INJECTION 61% COMPARISON:  None. FINDINGS: CT CHEST There is a small left pneumothorax, collected in the anterior base, around 5% by volume. Mild ground-glass opacity in the lingula may be due to contusion or hemorrhage. The right lung is clear. Airways are patent and intact. No effusion. No intrathoracic vascular injury. Mediastinum is intact. There are fractures of the left fifth sixth and seventh ribs laterally. Sternum and vertebral column are intact. CT ABDOMEN AND PELVIS There are unremarkable intact appearances of the liver, spleen, pancreas, adrenals and kidneys. The abdominal aorta is normal in caliber with mild atherosclerotic calcification. Bowel is unremarkable. There is no peritoneal blood or free air. No fractures are evident in the abdomen or pelvis. IMPRESSION: 1. Very small left pneumothorax collected anteriorly. Estimate 5% by volume. 2. Acute fractures of the left fifth through seventh ribs laterally 3. No acute traumatic injury in the abdomen or pelvis 4. These results will be called to the ordering clinician or representative by the Radiologist Assistant, and communication documented in the PACS or zVision Dashboard. Electronically Signed   By: DaAndreas Newport.D.   On: 05/28/2015 21:48   Dg Chest Portable 1 View  05/28/2015  CLINICAL DATA:  Trauma -fall 12 feet onto cement.  Initial encounter. EXAM: PORTABLE CHEST 1 VIEW COMPARISON:  10/06/2011 FINDINGS: The cardiomediastinal silhouette is unremarkable. This is a mildly low volume film. There is no evidence of focal airspace disease, pulmonary edema, suspicious pulmonary nodule/mass, pleural effusion, or pneumothorax. Fractures of the lateral left fifth, sixth and seventh ribs noted. IMPRESSION: Fractures of the lateral left fifth sixth and seventh ribs. No evidence of hemothorax or pneumothorax. Electronically Signed   By: JeMargarette Canada.D.   On: 05/28/2015 21:16    Review of Systems  Constitutional: Negative for fever and chills.  Respiratory: Negative for cough and shortness of breath.   Gastrointestinal: Negative for abdominal pain.  Musculoskeletal: Negative for neck pain.    Blood pressure 122/90, pulse 99,  temperature 98 F (36.7 C), temperature source Oral, resp. rate 24, SpO2 97 %. Physical Exam  Constitutional: He is oriented to person, place, and time. He appears well-developed and well-nourished.  HENT:  Head: Normocephalic and atraumatic.  Right Ear: External ear normal.  Left Ear: External ear normal.  Mouth/Throat: Oropharynx is clear and moist.  Eyes: EOM are normal. Pupils are equal, round, and reactive to light.  Neck: Neck supple.  Cardiovascular: Normal rate, regular rhythm and normal heart sounds.   Respiratory: Effort normal and breath sounds normal. He has no wheezes. He has no rales. He exhibits tenderness (left sided).  GI: Soft. There is no tenderness.  Musculoskeletal: Normal range of motion.  Neurological: He is alert and oriented to person, place, and time.     Assessment/Plan Left rib fx, ct found pneumothorax  Admit, pain control, pulm toilet, repeat chest xray in am  Jelina Paulsen, MD 05/29/2015, 12:17 AM

## 2016-10-28 IMAGING — CR DG CHEST 2V
2 series · 2 of 2 positions shown · non-contrast
Comparison: 05/28/2015

CLINICAL DATA: Left rib fractures, possible pneumothorax

EXAM:
CHEST  2 VIEW

[chest pa]
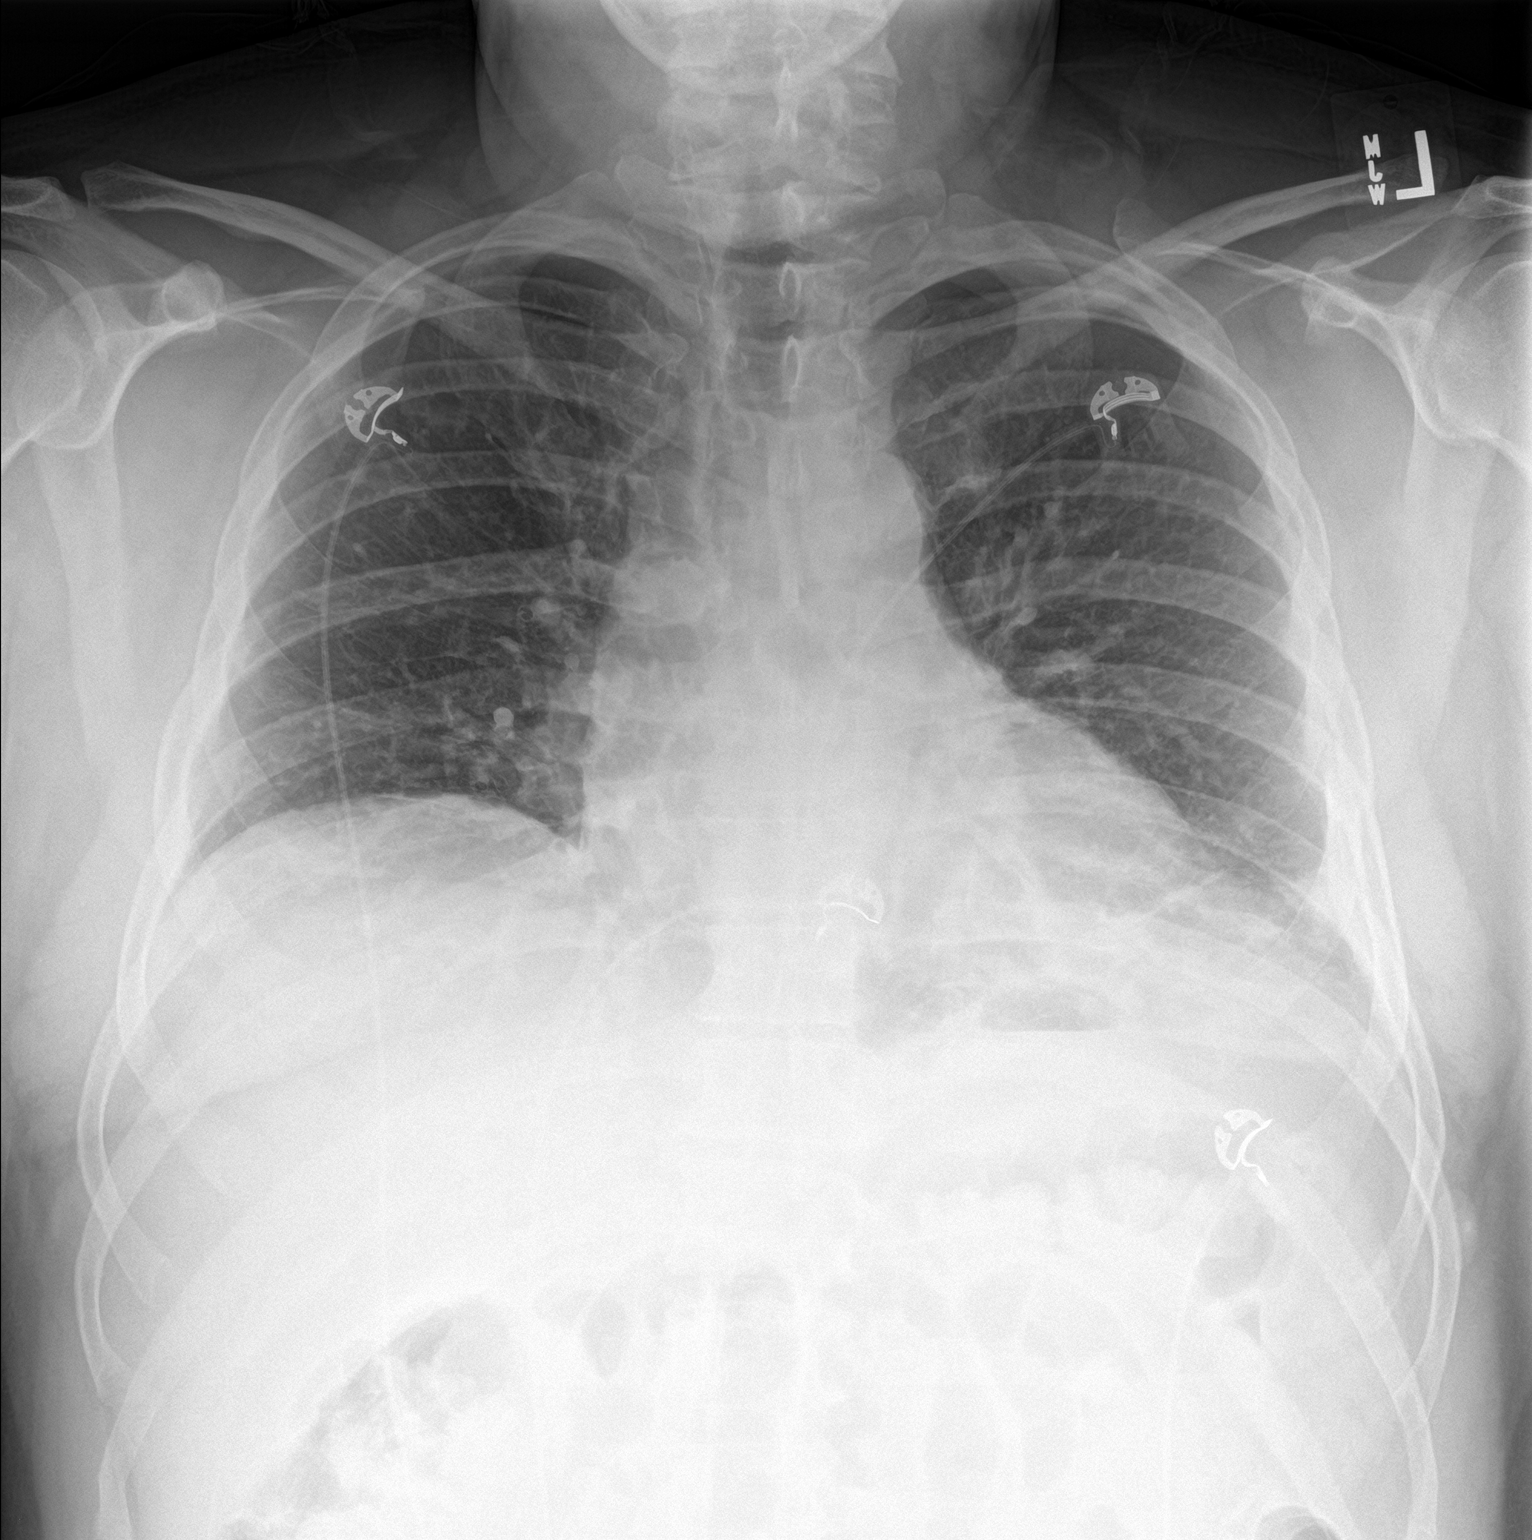

[chest lat]
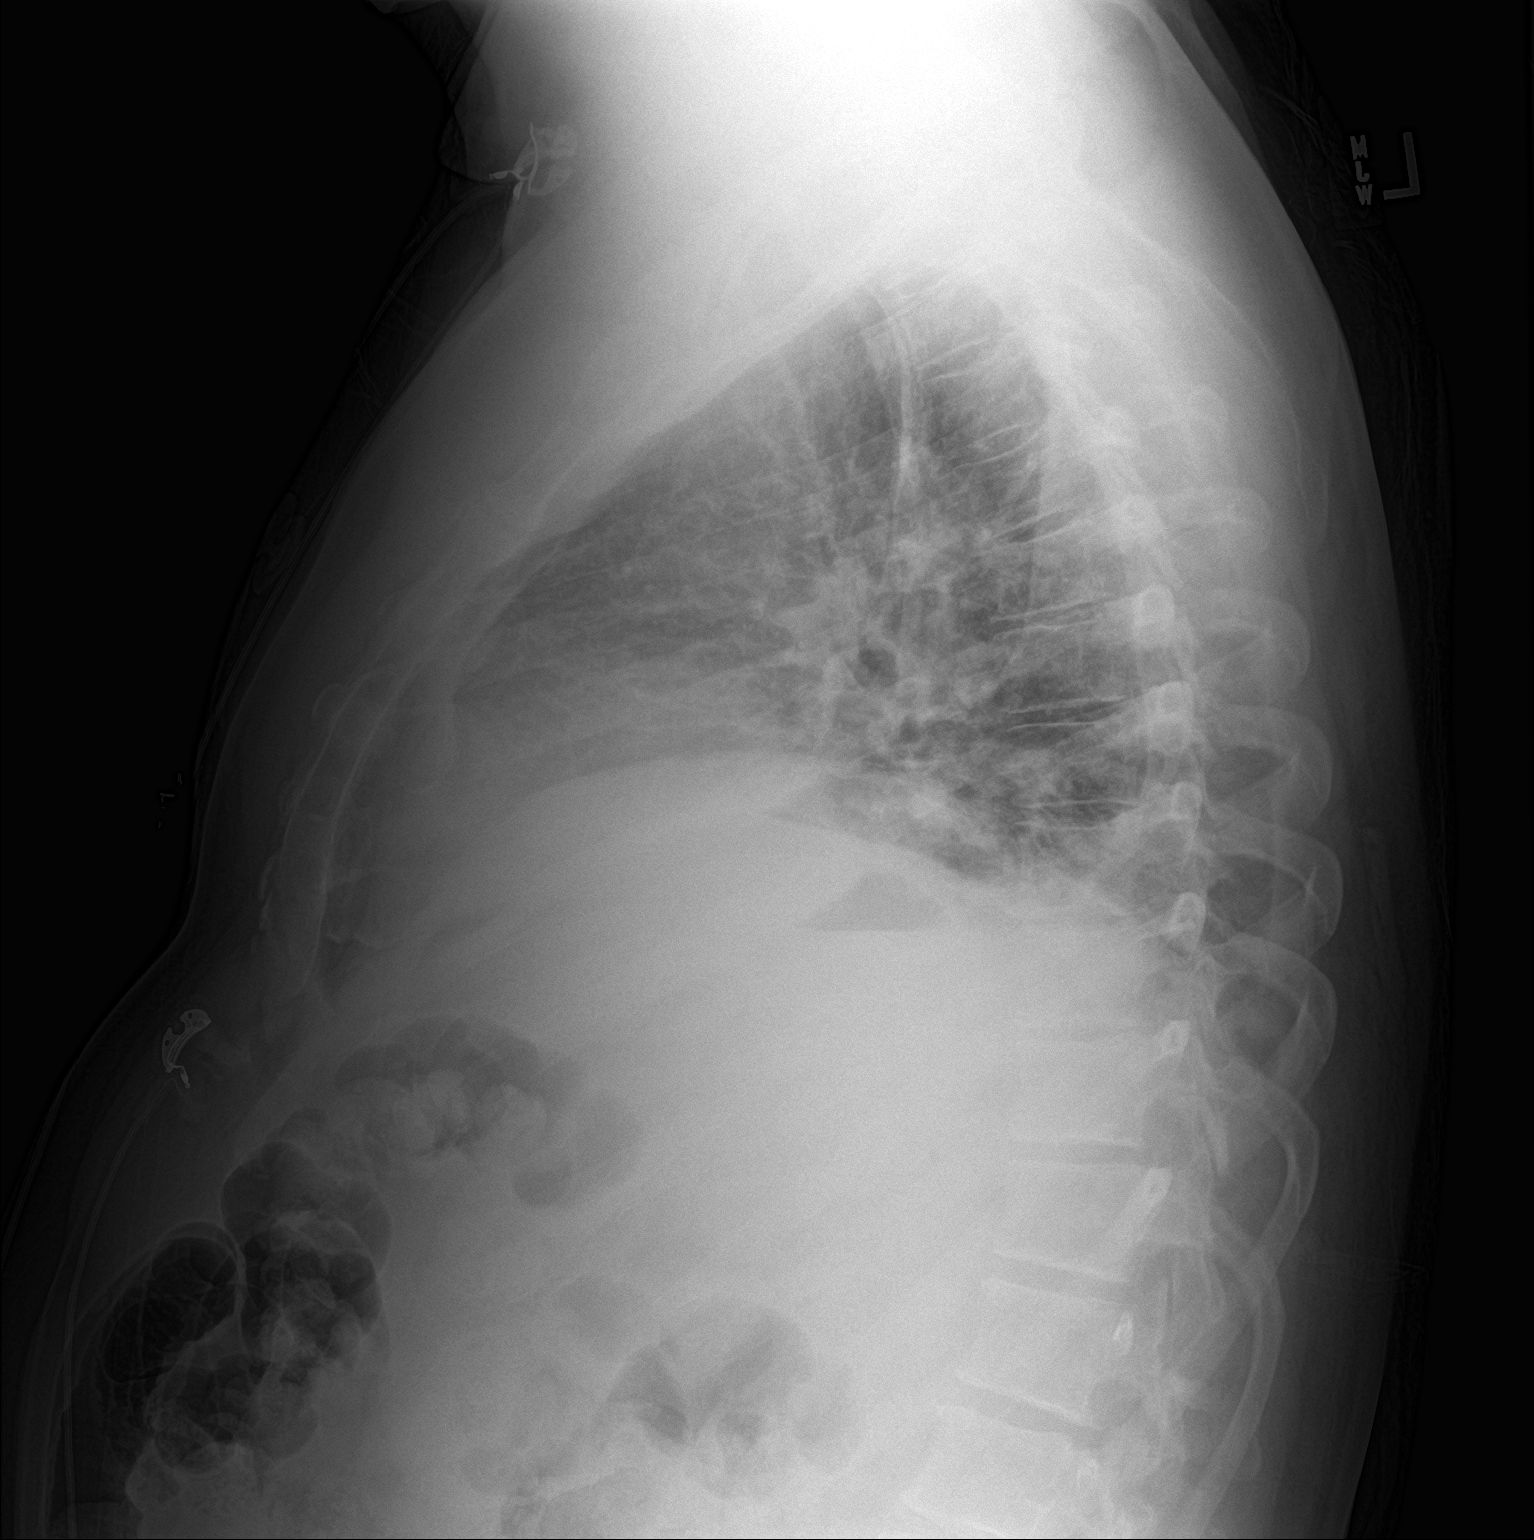

[2 of 2 positions shown; findings below may reference images not displayed]

FINDINGS: Cardiomediastinal silhouette is stable. Mild left basilar
atelectasis or infiltrate. Again noted fracture of the left fifth,
sixth and seventh rib. Small left apical pneumothorax about 5%.
IMPRESSION: Mild left basilar atelectasis or infiltrate. Again noted fracture of
the left fifth, sixth and seventh rib. Small left apical
pneumothorax about 5%.
